# Patient Record
Sex: Female | Born: 1980 | Race: Black or African American | Hispanic: No | Smoking: Current some day smoker
Health system: Southern US, Community
[De-identification: ages and names within clinical notes are randomized; demographics above are authoritative.]

## PROBLEM LIST (undated history)

## (undated) HISTORY — PX: TUBAL LIGATION: SHX77

---
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
## 2009-03-09 ENCOUNTER — Emergency Department (HOSPITAL_BASED_OUTPATIENT_CLINIC_OR_DEPARTMENT_OTHER): Admission: EM | Admit: 2009-03-09 | Discharge: 2009-03-09 | Payer: Self-pay | Admitting: Emergency Medicine

## 2009-06-19 ENCOUNTER — Emergency Department (HOSPITAL_BASED_OUTPATIENT_CLINIC_OR_DEPARTMENT_OTHER): Admission: EM | Admit: 2009-06-19 | Discharge: 2009-06-19 | Payer: Self-pay | Admitting: Emergency Medicine

## 2009-11-26 ENCOUNTER — Emergency Department (HOSPITAL_BASED_OUTPATIENT_CLINIC_OR_DEPARTMENT_OTHER): Admission: EM | Admit: 2009-11-26 | Discharge: 2009-11-27 | Payer: Self-pay | Admitting: Emergency Medicine

## 2010-11-28 ENCOUNTER — Emergency Department (HOSPITAL_BASED_OUTPATIENT_CLINIC_OR_DEPARTMENT_OTHER)
Admission: EM | Admit: 2010-11-28 | Discharge: 2010-11-28 | Disposition: A | Discharge status: Home / Self Care | Payer: Medicaid Other | Attending: Emergency Medicine | Admitting: Emergency Medicine

## 2010-11-28 DIAGNOSIS — S0180XA Unspecified open wound of other part of head, initial encounter: Secondary | ICD-10-CM | POA: Insufficient documentation

## 2010-11-28 DIAGNOSIS — F17200 Nicotine dependence, unspecified, uncomplicated: Secondary | ICD-10-CM | POA: Insufficient documentation

## 2010-11-28 DIAGNOSIS — W2209XA Striking against other stationary object, initial encounter: Secondary | ICD-10-CM | POA: Insufficient documentation

## 2010-11-28 DIAGNOSIS — Y92009 Unspecified place in unspecified non-institutional (private) residence as the place of occurrence of the external cause: Secondary | ICD-10-CM | POA: Insufficient documentation

## 2010-12-21 LAB — URINE MICROSCOPIC-ADD ON

## 2010-12-21 LAB — URINALYSIS, ROUTINE W REFLEX MICROSCOPIC
Bilirubin Urine: NEGATIVE
Glucose, UA: NEGATIVE mg/dL
Ketones, ur: NEGATIVE mg/dL
Nitrite: POSITIVE — AB
Protein, ur: NEGATIVE mg/dL
Specific Gravity, Urine: 1.011 (ref 1.005–1.030)
Urobilinogen, UA: 0.2 mg/dL (ref 0.0–1.0)
pH: 6.5 (ref 5.0–8.0)

## 2010-12-21 LAB — URINE CULTURE: Colony Count: >=100000

## 2010-12-21 LAB — PREGNANCY, URINE: Preg Test, Ur: NEGATIVE

## 2011-01-06 LAB — URINE MICROSCOPIC-ADD ON

## 2011-01-06 LAB — URINALYSIS, ROUTINE W REFLEX MICROSCOPIC
Glucose, UA: NEGATIVE mg/dL
Nitrite: POSITIVE — AB
Specific Gravity, Urine: 1.015 (ref 1.005–1.030)
pH: 6 (ref 5.0–8.0)

## 2011-01-06 LAB — URINE CULTURE

## 2011-01-09 LAB — URINE CULTURE

## 2011-01-09 LAB — URINE MICROSCOPIC-ADD ON

## 2011-01-09 LAB — URINALYSIS, ROUTINE W REFLEX MICROSCOPIC
Bilirubin Urine: NEGATIVE
Leukocytes, UA: NEGATIVE
Nitrite: POSITIVE — AB
Specific Gravity, Urine: 1.015 (ref 1.005–1.030)
Urobilinogen, UA: 0.2 mg/dL (ref 0.0–1.0)

## 2011-01-09 LAB — GC/CHLAMYDIA PROBE AMP, GENITAL: Chlamydia, DNA Probe: NEGATIVE

## 2011-01-09 LAB — WET PREP, GENITAL: Trich, Wet Prep: NONE SEEN

## 2011-01-09 LAB — PREGNANCY, URINE: Preg Test, Ur: NEGATIVE

## 2012-02-11 ENCOUNTER — Emergency Department (INDEPENDENT_AMBULATORY_CARE_PROVIDER_SITE_OTHER): Payer: BC Managed Care – PPO

## 2012-02-11 ENCOUNTER — Emergency Department (HOSPITAL_BASED_OUTPATIENT_CLINIC_OR_DEPARTMENT_OTHER)
Admission: EM | Admit: 2012-02-11 | Discharge: 2012-02-11 | Disposition: A | Discharge status: Home / Self Care | Payer: BC Managed Care – PPO | Attending: Emergency Medicine | Admitting: Emergency Medicine

## 2012-02-11 ENCOUNTER — Encounter (HOSPITAL_BASED_OUTPATIENT_CLINIC_OR_DEPARTMENT_OTHER): Payer: Self-pay | Admitting: *Deleted

## 2012-02-11 DIAGNOSIS — R1031 Right lower quadrant pain: Secondary | ICD-10-CM

## 2012-02-11 DIAGNOSIS — N390 Urinary tract infection, site not specified: Secondary | ICD-10-CM

## 2012-02-11 DIAGNOSIS — Z9851 Tubal ligation status: Secondary | ICD-10-CM | POA: Insufficient documentation

## 2012-02-11 DIAGNOSIS — R350 Frequency of micturition: Secondary | ICD-10-CM | POA: Insufficient documentation

## 2012-02-11 DIAGNOSIS — F17200 Nicotine dependence, unspecified, uncomplicated: Secondary | ICD-10-CM | POA: Insufficient documentation

## 2012-02-11 DIAGNOSIS — R110 Nausea: Secondary | ICD-10-CM | POA: Insufficient documentation

## 2012-02-11 DIAGNOSIS — R300 Dysuria: Secondary | ICD-10-CM | POA: Insufficient documentation

## 2012-02-11 LAB — DIFFERENTIAL
Basophils Relative: 0 % (ref 0–1)
Lymphocytes Relative: 32 % (ref 12–46)
Monocytes Absolute: 0.4 10*3/uL (ref 0.1–1.0)
Monocytes Relative: 8 % (ref 3–12)
Neutro Abs: 3.1 10*3/uL (ref 1.7–7.7)

## 2012-02-11 LAB — URINALYSIS, ROUTINE W REFLEX MICROSCOPIC
Ketones, ur: NEGATIVE mg/dL
Nitrite: POSITIVE — AB
Protein, ur: NEGATIVE mg/dL

## 2012-02-11 LAB — COMPREHENSIVE METABOLIC PANEL
BUN: 4 mg/dL — ABNORMAL LOW (ref 6–23)
CO2: 25 mEq/L (ref 19–32)
Chloride: 104 mEq/L (ref 96–112)
Creatinine, Ser: 0.7 mg/dL (ref 0.50–1.10)
GFR calc Af Amer: >90 mL/min (ref >90)
GFR calc non Af Amer: >90 mL/min (ref >90)
Total Bilirubin: 0.4 mg/dL (ref 0.3–1.2)

## 2012-02-11 LAB — CBC
HCT: 36 % (ref 36.0–46.0)
Hemoglobin: 12.9 g/dL (ref 12.0–15.0)
MCHC: 35.8 g/dL (ref 30.0–36.0)

## 2012-02-11 LAB — LIPASE, BLOOD: Lipase: 13 U/L (ref 11–59)

## 2012-02-11 MED ORDER — PHENAZOPYRIDINE HCL 200 MG PO TABS: 200.0000 mg | ORAL_TABLET | Freq: Three times a day (TID) | ORAL | Status: AC | PRN

## 2012-02-11 MED ORDER — SODIUM CHLORIDE 0.9 % IV BOLUS (SEPSIS)
1000.0000 mL | Freq: Once | INTRAVENOUS | Status: AC
  Administered 2012-02-11: 1000 mL via INTRAVENOUS

## 2012-02-11 MED ORDER — CEPHALEXIN 500 MG PO CAPS: 500.0000 mg | ORAL_CAPSULE | Freq: Four times a day (QID) | ORAL | Status: AC

## 2012-02-11 MED ORDER — IOHEXOL 300 MG/ML  SOLN
20.0000 mL | INTRAMUSCULAR | Status: AC
  Administered 2012-02-11 (×2): 20 mL via ORAL

## 2012-02-11 MED ORDER — MORPHINE SULFATE 4 MG/ML IJ SOLN
4.0000 mg | Freq: Once | INTRAMUSCULAR | Status: AC
  Administered 2012-02-11: 4 mg via INTRAVENOUS
  Filled 2012-02-11: qty 1

## 2012-02-11 MED ORDER — CEPHALEXIN 250 MG PO CAPS
500.0000 mg | ORAL_CAPSULE | Freq: Once | ORAL | Status: AC
  Administered 2012-02-11: 500 mg via ORAL
  Filled 2012-02-11: qty 2

## 2012-02-11 MED ORDER — IOHEXOL 300 MG/ML  SOLN: 100.0000 mL | Freq: Once | INTRAMUSCULAR | Status: DC | PRN

## 2012-02-11 MED ORDER — PHENAZOPYRIDINE HCL 100 MG PO TABS
200.0000 mg | ORAL_TABLET | Freq: Once | ORAL | Status: AC
  Administered 2012-02-11: 200 mg via ORAL
  Filled 2012-02-11: qty 2

## 2012-02-11 NOTE — Discharge Instructions (Signed)

## 2012-02-11 NOTE — ED Provider Notes (Signed)
History     CSN: [id number]  Arrival date & time 02/11/12  1025   First MD Initiated Contact with Patient 02/11/12 1041      Chief Complaint  Patient presents with  . Abdominal Pain    (Consider location/radiation/quality/duration/timing/severity/associated sxs/prior treatment) HPI Patient is a 31 year old female who presents today complaining of right lower quadrant pain that began suddenly this morning. Patient felt fine before going to bed last night. Patient has noticed decreased appetite over the past 3 days. She has a history of a right ovarian cyst that did not require surgical intervention. She does feel the pain is somewhat similar this. She denies any vaginal discharge or bleeding. She's had tubal ligation and does not suspect she could be pregnant. Patient's surgical history is significant only for one C-section. She denies any constipation or diarrhea. She endorses nausea but no vomiting. Patient also has noted some dysuria and increased urinary frequency. She does feel that her right lower quadrant pain radiates into her back. She rates this as an 8/10. She has not taken anything for this prior to admission.There are no other associated or modifying factors.  History reviewed. No pertinent past medical history.  Past Surgical History  Procedure Date  . Cesarean section   . Tubal ligation     No family history on file.  History  Substance Use Topics  . Smoking status: Current Some Day Smoker  . Smokeless tobacco: Not on file  . Alcohol Use: Yes    OB History    Grav Para Term Preterm Abortions TAB SAB Ect Mult Living                  Review of Systems  Constitutional: Positive for appetite change.  HENT: Negative.   Eyes: Negative.   Respiratory: Negative.   Cardiovascular: Negative.   Gastrointestinal: Positive for nausea and abdominal pain.  Genitourinary: Positive for dysuria and frequency. Negative for vaginal discharge.  Musculoskeletal: Negative.     Skin: Negative.   Neurological: Negative.   Hematological: Negative.   Psychiatric/Behavioral: Negative.   All other systems reviewed and are negative.    Allergies  Review of patient's allergies indicates no known allergies.  Home Medications  No current outpatient prescriptions on file.  BP 123/74  Pulse 80  Temp(Src) 98.3 F (36.8 C) (Oral)  Resp 16  Ht 5\' 7"  (1.702 m)  Wt 157 lb 9.6 oz (71.487 kg)  BMI 24.68 kg/m2  SpO2 100%  Physical Exam  Nursing note and vitals reviewed. GEN: Well-developed, well-nourished female in no distress HEENT: Atraumatic, normocephalic. Oropharynx clear without erythema EYES: PERRLA BL, no scleral icterus. NECK: Trachea midline, no meningismus CV: regular rate and rhythm. No murmurs, rubs, or gallops PULM: No respiratory distress.  No crackles, wheezes, or rales. GI: soft, tender to palpation in the right lower quadrant. No guarding, rebound. + bowel sounds  GU: deferred Neuro: cranial nerves 2-12 intact, no abnormalities of strength or sensation, A and O x 3 MSK: Patient moves all 4 extremities symmetrically, no deformity, edema, or injury noted Skin: No rashes petechiae, purpura, or jaundice Psych: no abnormality of mood   ED Course  Procedures (including critical care time)  Labs Reviewed  COMPREHENSIVE METABOLIC PANEL - Abnormal; Notable for the following:    Glucose, Bld 116 (*)    BUN 4 (*)    All other components within normal limits  URINALYSIS, ROUTINE W REFLEX MICROSCOPIC - Abnormal; Notable for the following:    APPearance CLOUDY (*)  Nitrite POSITIVE (*)    Leukocytes, UA MODERATE (*)    All other components within normal limits  URINE MICROSCOPIC-ADD ON - Abnormal; Notable for the following:    Squamous Epithelial / LPF FEW (*)    Bacteria, UA MANY (*)    All other components within normal limits  CBC  DIFFERENTIAL  LIPASE, BLOOD  PREGNANCY, URINE  URINE CULTURE   Ct Abdomen Pelvis W Contrast  02/11/2012   *RADIOLOGY REPORT*  Clinical Data: Right lower quadrant pain  CT ABDOMEN AND PELVIS WITH CONTRAST  Technique:  Multidetector CT imaging of the abdomen and pelvis was performed following the standard protocol during bolus administration of intravenous contrast.  Contrast:  100 ml of Omnipaque  Comparison: None.  Findings: Lung bases are unremarkable.  Enhanced liver, spleen, pancreas and adrenals are unremarkable.  The enhanced kidneys are symmetrical in size.  No hydronephrosis or hydroureter.  No aortic aneurysm.  No calcified gallstones are noted within gallbladder.  Moderate stool noted in the right colon and transverse colon.  There is no pericecal inflammation.  The appendix is not identified.  No definite inflammatory changes are noted in the right lower quadrant of the abdomen.  No mesenteric fluid collection is noted.  Small amount of nonspecific fluid is noted within uterus.  There is probable a hemorrhagic follicle in the right ovary best seen in the coronal image 27.  Nonspecific mild thickening of urinary bladder wall.  No destructive bony lesions are noted within pelvis.  Sagittal images of the spine shows no destructive bony lesions. No small bowel obstruction.  No ascites or free air.  No adenopathy.  IMPRESSION:  1. No definite inflammatory changes are noted in the right lower quadrant of the abdomen.  No pericecal inflammation.  Appendix is not identified. 2.  Small amount of nonspecific fluid is noted within uterus. 3.  Question small hemorrhagic follicle within the right ovary. 4.  No small bowel obstruction. 5.  No hydronephrosis or hydroureter.  Original Report Authenticated By: Natasha Mead, M.D.     1. UTI (urinary tract infection)       MDM  Patient was evaluated by myself. Based on presentation patient did have workup for her symptoms. She did not have a leukocytosis or anemia. Renal panel was within normal limits. Urinalysis was concerning for urinary tract infection but given the  location of the patient's pain this could have just been a pyuria from an associated appendicitis. CT of abdomen and pelvis was performed. This did not show acute appendicitis or significant ovarian pathology. Patient did not have any vaginal discharge that would merit pelvic exam today. I think patient's pain is likely secondary to urinary tract infection. Urine culture was sent patient was given Keflex. She was also given a dose of per EMS for her pain. She remained hemodynamically stable. Patient was discharged with 10 days of Keflex as well as 3 days of pretty him. She followup with her primary care provider as needed.        Cyndra Numbers, MD 02/11/12 [phone]

## 2012-02-11 NOTE — ED Notes (Signed)
Patient had sudden onset of R side abd stabbing pain, no n/v/d, pain would not subside, no injury

## 2012-02-14 LAB — URINE CULTURE

## 2012-02-15 NOTE — ED Notes (Signed)
+   Urine Patient treated with keflex-sensitive to same-chart appended per protocol MD. 

## 2015-02-13 ENCOUNTER — Emergency Department (HOSPITAL_BASED_OUTPATIENT_CLINIC_OR_DEPARTMENT_OTHER): Payer: 59

## 2015-02-13 ENCOUNTER — Emergency Department (HOSPITAL_BASED_OUTPATIENT_CLINIC_OR_DEPARTMENT_OTHER)
Admission: EM | Admit: 2015-02-13 | Discharge: 2015-02-14 | Disposition: A | Discharge status: Home / Self Care | Payer: 59 | Attending: Emergency Medicine | Admitting: Emergency Medicine

## 2015-02-13 ENCOUNTER — Encounter (HOSPITAL_BASED_OUTPATIENT_CLINIC_OR_DEPARTMENT_OTHER): Payer: Self-pay

## 2015-02-13 DIAGNOSIS — Z720 Tobacco use: Secondary | ICD-10-CM | POA: Insufficient documentation

## 2015-02-13 DIAGNOSIS — N730 Acute parametritis and pelvic cellulitis: Secondary | ICD-10-CM | POA: Diagnosis not present

## 2015-02-13 DIAGNOSIS — R1031 Right lower quadrant pain: Secondary | ICD-10-CM | POA: Diagnosis present

## 2015-02-13 DIAGNOSIS — Z3202 Encounter for pregnancy test, result negative: Secondary | ICD-10-CM | POA: Insufficient documentation

## 2015-02-13 LAB — BASIC METABOLIC PANEL
ANION GAP: 8 (ref 5–15)
BUN: 7 mg/dL (ref 6–20)
CO2: 25 mmol/L (ref 22–32)
CREATININE: 0.64 mg/dL (ref 0.44–1.00)
Calcium: 8.7 mg/dL — ABNORMAL LOW (ref 8.9–10.3)
Chloride: 104 mmol/L (ref 101–111)
GFR calc Af Amer: >60 mL/min (ref >60)
GLUCOSE: 89 mg/dL (ref 65–99)
Potassium: 3.4 mmol/L — ABNORMAL LOW (ref 3.5–5.1)
SODIUM: 137 mmol/L (ref 135–145)

## 2015-02-13 LAB — URINALYSIS, ROUTINE W REFLEX MICROSCOPIC
Bilirubin Urine: NEGATIVE
GLUCOSE, UA: NEGATIVE mg/dL
Ketones, ur: NEGATIVE mg/dL
LEUKOCYTES UA: NEGATIVE
Nitrite: POSITIVE — AB
PH: 6 (ref 5.0–8.0)
PROTEIN: NEGATIVE mg/dL
SPECIFIC GRAVITY, URINE: 1.016 (ref 1.005–1.030)
UROBILINOGEN UA: 1 mg/dL (ref 0.0–1.0)

## 2015-02-13 LAB — PREGNANCY, URINE: Preg Test, Ur: NEGATIVE

## 2015-02-13 LAB — CBC WITH DIFFERENTIAL/PLATELET
BASOS PCT: 0 % (ref 0–1)
Basophils Absolute: 0 10*3/uL (ref 0.0–0.1)
EOS PCT: 3 % (ref 0–5)
Eosinophils Absolute: 0.2 10*3/uL (ref 0.0–0.7)
HEMATOCRIT: 35.8 % — AB (ref 36.0–46.0)
HEMOGLOBIN: 12.7 g/dL (ref 12.0–15.0)
LYMPHS PCT: 34 % (ref 12–46)
Lymphs Abs: 2.4 10*3/uL (ref 0.7–4.0)
MCH: 31.4 pg (ref 26.0–34.0)
MCHC: 35.5 g/dL (ref 30.0–36.0)
MCV: 88.4 fL (ref 78.0–100.0)
MONO ABS: 0.5 10*3/uL (ref 0.1–1.0)
Monocytes Relative: 8 % (ref 3–12)
Neutro Abs: 3.9 10*3/uL (ref 1.7–7.7)
Neutrophils Relative %: 55 % (ref 43–77)
PLATELETS: 165 10*3/uL (ref 150–400)
RBC: 4.05 MIL/uL (ref 3.87–5.11)
RDW: 12.2 % (ref 11.5–15.5)
WBC: 7.1 10*3/uL (ref 4.0–10.5)

## 2015-02-13 LAB — URINE MICROSCOPIC-ADD ON

## 2015-02-13 LAB — LIPASE, BLOOD: LIPASE: 25 U/L (ref 22–51)

## 2015-02-13 LAB — WET PREP, GENITAL
Trich, Wet Prep: NONE SEEN
Yeast Wet Prep HPF POC: NONE SEEN

## 2015-02-13 MED ORDER — DEXTROSE 5 % IV SOLN
250.0000 mg | Freq: Once | INTRAVENOUS | Status: AC
  Filled 2015-02-13: qty 250

## 2015-02-13 MED ORDER — CEFTRIAXONE SODIUM 250 MG IJ SOLR: 250.0000 mg | Freq: Once | INTRAMUSCULAR | Status: DC

## 2015-02-13 MED ORDER — CEFTRIAXONE SODIUM 250 MG IJ SOLR
INTRAMUSCULAR | Status: AC
  Administered 2015-02-13: 250 mg
  Filled 2015-02-13: qty 250

## 2015-02-13 MED ORDER — FENTANYL CITRATE (PF) 100 MCG/2ML IJ SOLN
50.0000 ug | Freq: Once | INTRAMUSCULAR | Status: AC
  Administered 2015-02-13: 50 ug via INTRAVENOUS
  Filled 2015-02-13: qty 2

## 2015-02-13 MED ORDER — AZITHROMYCIN 250 MG PO TABS
1000.0000 mg | ORAL_TABLET | Freq: Once | ORAL | Status: AC
  Administered 2015-02-13: 1000 mg via ORAL
  Filled 2015-02-13: qty 4

## 2015-02-13 MED ORDER — METRONIDAZOLE 500 MG PO TABS: 500.0000 mg | ORAL_TABLET | Freq: Two times a day (BID) | ORAL | Status: AC

## 2015-02-13 NOTE — ED Provider Notes (Signed)
CSN: [id number]     Arrival date & time 02/13/15  2112 History   First MD Initiated Contact with Patient 02/13/15 2149     Chief Complaint  Patient presents with  . RLQ pain      (Consider location/radiation/quality/duration/timing/severity/associated sxs/prior Treatment) HPI Sherry Bowman is a 34 y.o. female because of her evaluation of right lower quadrant discomfort. Patient states she noticed the discomfort earlier today, this morning. Describes it as a gradual onset, sharp, 10 out of 10 pain. Has not tried anything to improve her discomfort. She reports associated vaginal discharge. Reports that she recently found out her husband has been cheating on her and she would like a full STI workup. She denies fevers, nausea, vomiting, diarrhea, constipation, urinary symptoms, back pain, dizziness, syncope. No other aggravating or modifying factors. No history of previous abdominal surgeries.  History reviewed. No pertinent past medical history. Past Surgical History  Procedure Laterality Date  . Cesarean section    . Tubal ligation     History reviewed. No pertinent family history. History  Substance Use Topics  . Smoking status: Current Some Day Smoker  . Smokeless tobacco: Not on file  . Alcohol Use: Yes   OB History    No data available     Review of Systems A 10 point review of systems was completed and was negative except for pertinent positives and negatives as mentioned in the history of present illness     Allergies  Review of patient's allergies indicates no known allergies.  Home Medications   Prior to Admission medications   Medication Sig Start Date End Date Taking? Authorizing Provider  metroNIDAZOLE (FLAGYL) 500 MG tablet Take 1 tablet (500 mg total) by mouth 2 (two) times daily. 02/13/15   Osbaldo Mark, PA-C   BP 138/68 mmHg  Pulse 79  Temp(Src) 98.7 F (37.1 C) (Oral)  Resp 16  Ht  (1.676 m)  Wt 158 lb (71.668 kg)  BMI 25.51 kg/m2  SpO2 100%   LMP 02/01/2015 Physical Exam  Constitutional: She is oriented to person, place, and time. She appears well-developed and well-nourished.  HENT:  Head: Normocephalic and atraumatic.  Mouth/Throat: Oropharynx is clear and moist.  Eyes: Conjunctivae are normal. Pupils are equal, round, and reactive to light. Right eye exhibits no discharge. Left eye exhibits no discharge. No scleral icterus.  Neck: Neck supple.  Cardiovascular: Normal rate, regular rhythm and normal heart sounds.   Pulmonary/Chest: Effort normal and breath sounds normal. No respiratory distress. She has no wheezes. She has no rales.  Abdominal: Soft. There is no tenderness.  Genitourinary:  Chaperone was present for the entire genital exam. No lesions or rashes appreciated on vulva. Cervix visualized on speculum exam and appropriate cultures sampled. Scant blood in vaginal vault. Discharge: Scant Zwiebel discharge Upon bi manual exam-  TTP of the right adnexa with cervical motion tenderness. No fullness or masses appreciated. No abnormalities appreciated in structural anatomy.   Musculoskeletal: She exhibits no tenderness.  Neurological: She is alert and oriented to person, place, and time.  Cranial Nerves II-XII grossly intact  Skin: Skin is warm and dry. No rash noted.  Psychiatric: She has a normal mood and affect.  Nursing note and vitals reviewed.   ED Course  Procedures (including critical care time) Labs Review Labs Reviewed  WET PREP, GENITAL - Abnormal; Notable for the following:    Clue Cells Wet Prep HPF POC MODERATE (*)    WBC, Wet Prep HPF POC FEW (*)  All other components within normal limits  BASIC METABOLIC PANEL - Abnormal; Notable for the following:    Potassium 3.4 (*)    Calcium 8.7 (*)    All other components within normal limits  CBC WITH DIFFERENTIAL/PLATELET - Abnormal; Notable for the following:    HCT 35.8 (*)    All other components within normal limits  URINALYSIS, ROUTINE W REFLEX  MICROSCOPIC - Abnormal; Notable for the following:    APPearance CLOUDY (*)    Hgb urine dipstick TRACE (*)    Nitrite POSITIVE (*)    All other components within normal limits  URINE MICROSCOPIC-ADD ON - Abnormal; Notable for the following:    Squamous Epithelial / LPF FEW (*)    Bacteria, UA MANY (*)    All other components within normal limits  LIPASE, BLOOD  PREGNANCY, URINE  HIV ANTIBODY (ROUTINE TESTING)  GC/CHLAMYDIA PROBE AMP ()    Imaging Review [place] Transvaginal Non-ob  02/13/2015   CLINICAL DATA:  Acute onset of right lower quadrant abdominal pain and vaginal discharge. Initial encounter.  EXAM: TRANSABDOMINAL AND TRANSVAGINAL ULTRASOUND OF PELVIS  DOPPLER ULTRASOUND OF OVARIES  TECHNIQUE: Both transabdominal and transvaginal ultrasound examinations of the pelvis were performed. Transabdominal technique was performed for global imaging of the pelvis including uterus, ovaries, adnexal regions, and pelvic cul-de-sac.  It was necessary to proceed with endovaginal exam following the transabdominal exam to visualize the uterus and ovaries in greater detail. Color and duplex Doppler ultrasound was utilized to evaluate blood flow to the ovaries.  COMPARISON:  None.  FINDINGS: Uterus  Measurements: 9.4 x 5.4 x 5.9 cm. No fibroids or other mass visualized. An anterior C-section scar is noted.  Endometrium  Thickness: 1.3 cm.  No focal abnormality visualized.  Right ovary  Measurements: 3.0 x 2.4 x 2.5 cm. Normal appearance/no adnexal mass.  Left ovary  Measurements: 3.5 x 1.7 x 2.0 cm. Normal appearance/no adnexal mass.  Pulsed Doppler evaluation of both ovaries demonstrates normal low-resistance arterial and venous waveforms.  Other findings  No free fluid is seen within the pelvic cul-de-sac.  IMPRESSION: Unremarkable pelvic ultrasound.  No evidence for ovarian torsion.   Electronically Signed   By: Roanna RaiderJeffery  Chang M.D.   On: 02/13/2015 23:24   [place] Pelvis Complete  02/13/2015    CLINICAL DATA:  Acute onset of right lower quadrant abdominal pain and vaginal discharge. Initial encounter.  EXAM: TRANSABDOMINAL AND TRANSVAGINAL ULTRASOUND OF PELVIS  DOPPLER ULTRASOUND OF OVARIES  TECHNIQUE: Both transabdominal and transvaginal ultrasound examinations of the pelvis were performed. Transabdominal technique was performed for global imaging of the pelvis including uterus, ovaries, adnexal regions, and pelvic cul-de-sac.  It was necessary to proceed with endovaginal exam following the transabdominal exam to visualize the uterus and ovaries in greater detail. Color and duplex Doppler ultrasound was utilized to evaluate blood flow to the ovaries.  COMPARISON:  None.  FINDINGS: Uterus  Measurements: 9.4 x 5.4 x 5.9 cm. No fibroids or other mass visualized. An anterior C-section scar is noted.  Endometrium  Thickness: 1.3 cm.  No focal abnormality visualized.  Right ovary  Measurements: 3.0 x 2.4 x 2.5 cm. Normal appearance/no adnexal mass.  Left ovary  Measurements: 3.5 x 1.7 x 2.0 cm. Normal appearance/no adnexal mass.  Pulsed Doppler evaluation of both ovaries demonstrates normal low-resistance arterial and venous waveforms.  Other findings  No free fluid is seen within the pelvic cul-de-sac.  IMPRESSION: Unremarkable pelvic ultrasound.  No evidence for ovarian torsion.  Electronically Signed   By: Roanna RaiderJeffery  Chang M.D.   On: 02/13/2015 23:24   [place] Art/ven Flow Abd Pelv Doppler  02/13/2015   CLINICAL DATA:  Acute onset of right lower quadrant abdominal pain and vaginal discharge. Initial encounter.  EXAM: TRANSABDOMINAL AND TRANSVAGINAL ULTRASOUND OF PELVIS  DOPPLER ULTRASOUND OF OVARIES  TECHNIQUE: Both transabdominal and transvaginal ultrasound examinations of the pelvis were performed. Transabdominal technique was performed for global imaging of the pelvis including uterus, ovaries, adnexal regions, and pelvic cul-de-sac.  It was necessary to proceed with endovaginal exam following the  transabdominal exam to visualize the uterus and ovaries in greater detail. Color and duplex Doppler ultrasound was utilized to evaluate blood flow to the ovaries.  COMPARISON:  None.  FINDINGS: Uterus  Measurements: 9.4 x 5.4 x 5.9 cm. No fibroids or other mass visualized. An anterior C-section scar is noted.  Endometrium  Thickness: 1.3 cm.  No focal abnormality visualized.  Right ovary  Measurements: 3.0 x 2.4 x 2.5 cm. Normal appearance/no adnexal mass.  Left ovary  Measurements: 3.5 x 1.7 x 2.0 cm. Normal appearance/no adnexal mass.  Pulsed Doppler evaluation of both ovaries demonstrates normal low-resistance arterial and venous waveforms.  Other findings  No free fluid is seen within the pelvic cul-de-sac.  IMPRESSION: Unremarkable pelvic ultrasound.  No evidence for ovarian torsion.   Electronically Signed   By: Roanna RaiderJeffery  Chang M.D.   On: 02/13/2015 23:24     EKG Interpretation None     Meds given in ED:  Medications  fentaNYL (SUBLIMAZE) injection 50 mcg (50 mcg Intravenous Given 02/13/15 2239)  azithromycin (ZITHROMAX) tablet 1,000 mg (1,000 mg Oral Given 02/13/15 2239)  cefTRIAXone (ROCEPHIN) 250 mg in dextrose 5 % 50 mL IVPB (0 mg Intravenous Stopped 02/13/15 2305)  cefTRIAXone (ROCEPHIN) 250 MG injection (250 mg  Given 02/13/15 2243)    New Prescriptions   METRONIDAZOLE (FLAGYL) 500 MG TABLET    Take 1 tablet (500 mg total) by mouth 2 (two) times daily.   Filed Vitals:   02/13/15 2123  BP: 138/68  Pulse: 79  Temp: 98.7 F (37.1 C)  TempSrc: Oral  Resp: 16  Height: 5\' 6"  (1.676 m)  Weight: 158 lb (71.668 kg)  SpO2: 100%    MDM  Vitals stable - WNL -afebrile Pt resting comfortably in ED. PE--repeat abdominal exam is benign. Patient reports only mild discomfort and no tenderness to palpation in right lower quadrant. Pelvic exam more consistent with PID. Labwork--no leukocytosis. There is evidence of BV and few Bickford cells on wet prep. Possible UTI, patient appears to be nitrite  positive at baseline. Imaging--a transvaginal ultrasound negative for ovarian torsion, TOA  DDX--patient discomfort likely secondary to STI. Low concern for appendicitis, however, discussed this is still a possibility and will need to return to ED promptly for reevaluation within 6-12 hours if symptoms do not improve or worsen.  I discussed all relevant lab findings and imaging results with pt and they verbalized understanding. Discussed f/u with PCP within 48 hrs and return precautions, pt very amenable to plan. Prior to patient discharge, I discussed and reviewed this case with Dr. Silverio LayYao    Final diagnoses:  PID (acute pelvic inflammatory disease)       Joycie PeekBenjamin Niyana Chesbro, PA-C 02/14/15 0002  Richardean Canalavid H Yao, MD 02/14/15 [phone]

## 2015-02-13 NOTE — ED Notes (Signed)
Pt in u/s

## 2015-02-13 NOTE — ED Notes (Signed)
Pt reports one day of RLQ pain, denies N/V/D - reports milky Cormany vaginal discharge for one week - reports she wants to have full STI exposure workup.

## 2015-02-13 NOTE — Discharge Instructions (Signed)
You were evaluated in the ED today for your abdominal discomfort. You were treated for STI. It is also a possibility that you have an early appendicitis, it is imperative that you return to ED for reevaluation within 6-12 hours if your symptoms do not improve or worsen.  Abdominal Pain, Women Abdominal (stomach, pelvic, or belly) pain can be caused by many things. It is important to tell your doctor:  The location of the pain.  Does it come and go or is it present all the time?  Are there things that start the pain (eating certain foods, exercise)?  Are there other symptoms associated with the pain (fever, nausea, vomiting, diarrhea)? All of this is helpful to know when trying to find the cause of the pain. CAUSES   Stomach: virus or bacteria infection, or ulcer.  Intestine: appendicitis (inflamed appendix), regional ileitis (Crohn's disease), ulcerative colitis (inflamed colon), irritable bowel syndrome, diverticulitis (inflamed diverticulum of the colon), or cancer of the stomach or intestine.  Gallbladder disease or stones in the gallbladder.  Kidney disease, kidney stones, or infection.  Pancreas infection or cancer.  Fibromyalgia (pain disorder).  Diseases of the female organs:  Uterus: fibroid (non-cancerous) tumors or infection.  Fallopian tubes: infection or tubal pregnancy.  Ovary: cysts or tumors.  Pelvic adhesions (scar tissue).  Endometriosis (uterus lining tissue growing in the pelvis and on the pelvic organs).  Pelvic congestion syndrome (female organs filling up with blood just before the menstrual period).  Pain with the menstrual period.  Pain with ovulation (producing an egg).  Pain with an IUD (intrauterine device, birth control) in the uterus.  Cancer of the female organs.  Functional pain (pain not caused by a disease, may improve without treatment).  Psychological pain.  Depression. DIAGNOSIS  Your doctor will decide the seriousness of your  pain by doing an examination.  Blood tests.  X-rays.  Ultrasound.  CT scan (computed tomography, special type of X-ray).  MRI (magnetic resonance imaging).  Cultures, for infection.  Barium enema (dye inserted in the large intestine, to better view it with X-rays).  Colonoscopy (looking in intestine with a lighted tube).  Laparoscopy (minor surgery, looking in abdomen with a lighted tube).  Major abdominal exploratory surgery (looking in abdomen with a large incision). TREATMENT  The treatment will depend on the cause of the pain.   Many cases can be observed and treated at home.  Over-the-counter medicines recommended by your caregiver.  Prescription medicine.  Antibiotics, for infection.  Birth control pills, for painful periods or for ovulation pain.  Hormone treatment, for endometriosis.  Nerve blocking injections.  Physical therapy.  Antidepressants.  Counseling with a psychologist or psychiatrist.  Minor or major surgery. HOME CARE INSTRUCTIONS   Do not take laxatives, unless directed by your caregiver.  Take over-the-counter pain medicine only if ordered by your caregiver. Do not take aspirin because it can cause an upset stomach or bleeding.  Try a clear liquid diet (broth or water) as ordered by your caregiver. Slowly move to a bland diet, as tolerated, if the pain is related to the stomach or intestine.  Have a thermometer and take your temperature several times a day, and record it.  Bed rest and sleep, if it helps the pain.  Avoid sexual intercourse, if it causes pain.  Avoid stressful situations.  Keep your follow-up appointments and tests, as your caregiver orders.  If the pain does not go away with medicine or surgery, you may try:  Acupuncture.  Relaxation exercises (yoga, meditation).  Group therapy.  Counseling. SEEK MEDICAL CARE IF:   You notice certain foods cause stomach pain.  Your home care treatment is not helping  your pain.  You need stronger pain medicine.  You want your IUD removed.  You feel faint or lightheaded.  You develop nausea and vomiting.  You develop a rash.  You are having side effects or an allergy to your medicine. SEEK IMMEDIATE MEDICAL CARE IF:   Your pain does not go away or gets worse.  You have a fever.  Your pain is felt only in portions of the abdomen. The right side could possibly be appendicitis. The left lower portion of the abdomen could be colitis or diverticulitis.  You are passing blood in your stools (bright red or black tarry stools, with or without vomiting).  You have blood in your urine.  You develop chills, with or without a fever.  You pass out. MAKE SURE YOU:   Understand these instructions.  Will watch your condition.  Will get help right away if you are not doing well or get worse. Document Released: 07/16/2007 Document Revised: 02/02/2014 Document Reviewed: 08/05/2009 [facility] Patient Information 2015 [place], [place]. This information is not intended to replace advice given to you by your health care provider. Make sure you discuss any questions you have with your health care provider.

## 2015-02-13 NOTE — ED Notes (Signed)
Returned from u/s

## 2015-02-13 NOTE — ED Notes (Signed)
Pt states she has a ride home. 

## 2015-02-14 NOTE — ED Notes (Signed)
Gingerale given at d/c home. Pt states her ride is here to take her home.

## 2015-02-15 LAB — HIV ANTIBODY (ROUTINE TESTING W REFLEX): HIV Screen 4th Generation wRfx: NONREACTIVE

## 2015-02-16 LAB — GC/CHLAMYDIA PROBE AMP (~~LOC~~) NOT AT ARMC
Chlamydia: NEGATIVE
NEISSERIA GONORRHEA: NEGATIVE

## 2016-07-17 ENCOUNTER — Emergency Department (HOSPITAL_COMMUNITY): Admission: EM | Admit: 2016-07-17 | Discharge: 2016-07-17 | Discharge status: Against Medical Advice | Payer: 59

## 2016-07-17 NOTE — ED Notes (Signed)
Pt called twice to have vital signs done by emt first

## 2016-07-17 NOTE — ED Notes (Signed)
[facility] called for triage, no response.

## 2016-07-17 NOTE — ED Triage Notes (Signed)
Called x 1 no answer

## 2016-07-17 NOTE — ED Notes (Signed)
Pt called 3 times for triage, no response.

## 2016-07-17 NOTE — ED Triage Notes (Signed)
2nd call for pt with no response. 

## 2018-02-13 ENCOUNTER — Emergency Department (HOSPITAL_BASED_OUTPATIENT_CLINIC_OR_DEPARTMENT_OTHER)
Admission: EM | Admit: 2018-02-13 | Discharge: 2018-02-13 | Disposition: A | Discharge status: Home / Self Care | Attending: Emergency Medicine | Admitting: Emergency Medicine

## 2018-02-13 ENCOUNTER — Emergency Department (HOSPITAL_BASED_OUTPATIENT_CLINIC_OR_DEPARTMENT_OTHER)

## 2018-02-13 ENCOUNTER — Encounter (HOSPITAL_BASED_OUTPATIENT_CLINIC_OR_DEPARTMENT_OTHER): Payer: Self-pay | Admitting: Emergency Medicine

## 2018-02-13 ENCOUNTER — Other Ambulatory Visit: Payer: Self-pay

## 2018-02-13 DIAGNOSIS — Y990 Civilian activity done for income or pay: Secondary | ICD-10-CM | POA: Diagnosis not present

## 2018-02-13 DIAGNOSIS — M47812 Spondylosis without myelopathy or radiculopathy, cervical region: Secondary | ICD-10-CM | POA: Diagnosis not present

## 2018-02-13 DIAGNOSIS — Y92410 Unspecified street and highway as the place of occurrence of the external cause: Secondary | ICD-10-CM | POA: Insufficient documentation

## 2018-02-13 DIAGNOSIS — F17210 Nicotine dependence, cigarettes, uncomplicated: Secondary | ICD-10-CM | POA: Diagnosis not present

## 2018-02-13 DIAGNOSIS — M25511 Pain in right shoulder: Secondary | ICD-10-CM | POA: Diagnosis not present

## 2018-02-13 DIAGNOSIS — Y939 Activity, unspecified: Secondary | ICD-10-CM | POA: Diagnosis not present

## 2018-02-13 MED ORDER — NAPROXEN 500 MG PO TABS: 500.0000 mg | ORAL_TABLET | Freq: Two times a day (BID) | ORAL | 0 refills | Status: AC

## 2018-02-13 MED ORDER — IBUPROFEN 400 MG PO TABS
600.0000 mg | ORAL_TABLET | Freq: Once | ORAL | Status: AC
  Administered 2018-02-13: 600 mg via ORAL
  Filled 2018-02-13: qty 1

## 2018-02-13 MED ORDER — METHOCARBAMOL 500 MG PO TABS: 500.0000 mg | ORAL_TABLET | Freq: Two times a day (BID) | ORAL | 0 refills | Status: AC
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
## 2018-02-13 NOTE — ED Notes (Signed)
Patient transported to X-ray 

## 2018-02-13 NOTE — ED Notes (Signed)
ED Provider at bedside. 
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
## 2018-02-13 NOTE — ED Notes (Signed)
ED Provider at bedside discussing test results and dispo plan of care. 

## 2018-02-13 NOTE — Discharge Instructions (Addendum)
Your x-ray showed no evidence of an acute fracture dislocation of your shoulder, your neck x-ray shows some chronic degenerative changes but no acute abnormalities.  The pain your experiencing is likely due to muscle strain, you may take Naprosyn and Robaxin as needed for pain management. Do not combine with any pain reliever other than tylenol. The muscle soreness should improve over the next week. Follow up with your family doctor in the next week for a recheck if you are still having symptoms. Return to ED if pain is worsening, you develop weakness or numbness of extremities, or new or concerning symptoms develop.

## 2018-10-13 ENCOUNTER — Other Ambulatory Visit: Payer: Self-pay

## 2018-10-13 ENCOUNTER — Encounter (HOSPITAL_BASED_OUTPATIENT_CLINIC_OR_DEPARTMENT_OTHER): Payer: Self-pay | Admitting: Emergency Medicine

## 2018-10-13 ENCOUNTER — Emergency Department (HOSPITAL_BASED_OUTPATIENT_CLINIC_OR_DEPARTMENT_OTHER)
Admission: EM | Admit: 2018-10-13 | Discharge: 2018-10-13 | Disposition: A | Discharge status: Home / Self Care | Payer: 59 | Attending: Emergency Medicine | Admitting: Emergency Medicine

## 2018-10-13 DIAGNOSIS — F17210 Nicotine dependence, cigarettes, uncomplicated: Secondary | ICD-10-CM | POA: Insufficient documentation

## 2018-10-13 DIAGNOSIS — M542 Cervicalgia: Secondary | ICD-10-CM | POA: Insufficient documentation

## 2018-10-13 DIAGNOSIS — M25511 Pain in right shoulder: Secondary | ICD-10-CM

## 2018-10-13 DIAGNOSIS — Z79899 Other long term (current) drug therapy: Secondary | ICD-10-CM | POA: Insufficient documentation

## 2018-10-13 MED ORDER — CYCLOBENZAPRINE HCL 10 MG PO TABS: 10.0000 mg | ORAL_TABLET | Freq: Two times a day (BID) | ORAL | 0 refills | Status: AC | PRN

## 2018-10-13 NOTE — Discharge Instructions (Addendum)
Please read instructions below. Apply ice to your shoulder for 20 minutes at a time. Rest it as much as possible. Do gentle stretches. You can take ibuprofen every 6 hours as needed for pain. You can take flexeril at bedtime or on a day off for muscle spasm. Be aware this medication can make you drowsy, do not drive or drink alcohol while taking. Schedule an appointment with the sports medicine specialist as needed if symptoms persist. Return to the ER for new or concerning symptoms.
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
## 2018-10-13 NOTE — ED Notes (Signed)
Pt verbalized understanding of dc instructions.

## 2018-10-13 NOTE — ED Triage Notes (Signed)
R side neck pain radiating into her shoulder and neck x 1 month.
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
## 2020-09-05 ENCOUNTER — Ambulatory Visit
Admission: EM | Admit: 2020-09-05 | Discharge: 2020-09-05 | Disposition: A | Discharge status: Home / Self Care | Payer: Federal, State, Local not specified - PPO | Attending: Urgent Care | Admitting: Urgent Care

## 2020-09-05 ENCOUNTER — Other Ambulatory Visit: Payer: Self-pay

## 2020-09-05 DIAGNOSIS — Z791 Long term (current) use of non-steroidal anti-inflammatories (NSAID): Secondary | ICD-10-CM | POA: Insufficient documentation

## 2020-09-05 DIAGNOSIS — R3915 Urgency of urination: Secondary | ICD-10-CM | POA: Insufficient documentation

## 2020-09-05 DIAGNOSIS — M545 Low back pain, unspecified: Secondary | ICD-10-CM | POA: Diagnosis present

## 2020-09-05 DIAGNOSIS — Z202 Contact with and (suspected) exposure to infections with a predominantly sexual mode of transmission: Secondary | ICD-10-CM | POA: Insufficient documentation

## 2020-09-05 DIAGNOSIS — R109 Unspecified abdominal pain: Secondary | ICD-10-CM | POA: Diagnosis present

## 2020-09-05 DIAGNOSIS — F17200 Nicotine dependence, unspecified, uncomplicated: Secondary | ICD-10-CM | POA: Diagnosis not present

## 2020-09-05 LAB — POCT URINALYSIS DIP (MANUAL ENTRY)
Bilirubin, UA: NEGATIVE
Glucose, UA: NEGATIVE mg/dL
Ketones, POC UA: NEGATIVE mg/dL
Leukocytes, UA: NEGATIVE
Nitrite, UA: NEGATIVE
Protein Ur, POC: NEGATIVE mg/dL
Spec Grav, UA: 1.02 (ref 1.010–1.025)
Urobilinogen, UA: 0.2 E.U./dL
pH, UA: 7.5 (ref 5.0–8.0)

## 2020-09-05 LAB — POCT URINE PREGNANCY: Preg Test, Ur: NEGATIVE

## 2020-09-05 NOTE — ED Provider Notes (Signed)
[facility]   MRN: [id number] DOB: August 07, 1981  Subjective:   Sherry Bowman is a 39 y.o. female presenting for 5-day history of persistent belly cramping, lower back pain urinary urgency.  Denies fever, nausea, vomiting, dysuria, urinary frequency, hematuria, constipation, diarrhea, bloody stools.  Patient has a history of a C-section and tubal ligation.  Has not tried much medications for relief.  Patient works for the [employer] but does not do a lot of heavy lifting, hydrates very well.  No recent trauma, falls.  Patient's primary concern is that her husband is cheating on her and would like to make sure she does not have any sexually transmitted infections.  No current facility-administered medications for this encounter.  Current Outpatient Medications:  .  cyclobenzaprine (FLEXERIL) 10 MG tablet, Take 1 tablet (10 mg total) by mouth 2 (two) times daily as needed for muscle spasms., Disp: 14 tablet, Rfl: 0 .  methocarbamol (ROBAXIN) 500 MG tablet, Take 1 tablet (500 mg total) by mouth 2 (two) times daily., Disp: 20 tablet, Rfl: 0 .  metroNIDAZOLE (FLAGYL) 500 MG tablet, Take 1 tablet (500 mg total) by mouth 2 (two) times daily., Disp: 14 tablet, Rfl: 0 .  naproxen (NAPROSYN) 500 MG tablet, Take 1 tablet (500 mg total) by mouth 2 (two) times daily., Disp: 30 tablet, Rfl: 0   No Known Allergies  History reviewed. No pertinent past medical history.   Past Surgical History:  Procedure Laterality Date  . CESAREAN SECTION    . TUBAL LIGATION      History reviewed. No pertinent family history.  Social History   Tobacco Use  . Smoking status: Current Some Day Smoker  . Smokeless tobacco: Never Used  Vaping Use  . Vaping Use: Never used  Substance Use Topics  . Alcohol use: Yes    Comment: occ  . Drug use: No    ROS   Objective:   Vitals: BP 134/75 (BP Location: Right Arm)   Pulse 79   Temp 98.3 F (36.8 C) (Oral)   Resp 20   LMP 08/15/2020 (Exact Date)    SpO2 99%   Physical Exam Constitutional:      General: She is not in acute distress.    Appearance: Normal appearance. She is well-developed. She is not ill-appearing, toxic-appearing or diaphoretic.  HENT:     Head: Normocephalic and atraumatic.     Nose: Nose normal.     Mouth/Throat:     Mouth: Mucous membranes are moist.     Pharynx: Oropharynx is clear.  Eyes:     General: No scleral icterus.    Extraocular Movements: Extraocular movements intact.     Pupils: Pupils are equal, round, and reactive to light.  Cardiovascular:     Rate and Rhythm: Normal rate.  Pulmonary:     Effort: Pulmonary effort is normal.  Skin:    General: Skin is warm and dry.  Neurological:     General: No focal deficit present.     Mental Status: She is alert and oriented to person, place, and time.  Psychiatric:        Mood and Affect: Mood normal.        Behavior: Behavior normal.     Results for orders placed or performed during the hospital encounter of 09/05/20 (from the past 24 hour(s))  POCT urine pregnancy     Status: None   Collection Time: 09/05/20 10:33 AM  Result Value Ref Range   Preg Test, Ur Negative  Negative  POCT urinalysis dipstick     Status: Abnormal   Collection Time: 09/05/20 10:33 AM  Result Value Ref Range   Color, UA yellow yellow   Clarity, UA clear clear   Glucose, UA negative negative mg/dL   Bilirubin, UA negative negative   Ketones, POC UA negative negative mg/dL   Spec Grav, UA 0.102 7.253 - 1.025   Blood, UA trace-intact (A) negative   pH, UA 7.5 5.0 - 8.0   Protein Ur, POC negative negative mg/dL   Urobilinogen, UA 0.2 0.2 or 1.0 E.U./dL   Nitrite, UA Negative Negative   Leukocytes, UA Negative Negative    Assessment and Plan :   PDMP not reviewed this encounter.  1. Possible exposure to STD   2. Abdominal cramping   3. Acute low back pain without sciatica, unspecified back pain laterality     Vital signs stable patient is in no acute  distress, urinalysis unremarkable.  Patient has concerns about STI and therefore will do complete testing.  Recommended continued hydration, Tylenol. Counseled patient on potential for adverse effects with medications prescribed/recommended today, ER and return-to-clinic precautions discussed, patient verbalized understanding.    Wallis Bamberg, PA-C 09/05/20 1100

## 2020-09-05 NOTE — Discharge Instructions (Addendum)
Please continue to hydrate very well.  We will let you know about your test results as they come back. Do not use any nonsteroidal anti-inflammatories (NSAIDs) like ibuprofen, Motrin, naproxen, Aleve, etc. which are all available over-the-counter.  Please just use Tylenol at a dose of 500mg -650mg  once every 6 hours as needed for your aches, pains, fevers.  If your abdominal pain or low back pain worsens you can come back here or go to the emergency room if the pain is severe.

## 2020-09-05 NOTE — ED Triage Notes (Signed)
Patient states she has had abdominal cramping and lower back pain since Tuesday as well as some urinary urgency. Pt states no vomiting or diarrhea. Pt is aox4 and ambulatory.

## 2020-09-07 LAB — CERVICOVAGINAL ANCILLARY ONLY
Bacterial Vaginitis (gardnerella): NEGATIVE
Chlamydia: NEGATIVE
Comment: NEGATIVE
Comment: NEGATIVE
Comment: NEGATIVE
Comment: NORMAL
Neisseria Gonorrhea: NEGATIVE
Trichomonas: NEGATIVE

## 2020-09-07 LAB — HIV ANTIBODY (ROUTINE TESTING W REFLEX): HIV Screen 4th Generation wRfx: NONREACTIVE

## 2020-09-07 LAB — RPR: RPR Ser Ql: NONREACTIVE

## 2020-09-07 LAB — URINE CULTURE

## 2020-09-08 ENCOUNTER — Telehealth (HOSPITAL_COMMUNITY): Payer: Self-pay | Admitting: Emergency Medicine

## 2020-09-08 LAB — URINE CULTURE: Culture: >=100000 — AB

## 2020-09-08 MED ORDER — SULFAMETHOXAZOLE-TRIMETHOPRIM 800-160 MG PO TABS: 1.0000 | ORAL_TABLET | Freq: Two times a day (BID) | ORAL | 0 refills | Status: AC
